# Patient Record
Sex: Female | Born: 1950 | Race: White | Hispanic: No | Marital: Married | Smoking: Never smoker
Health system: Southern US, Community
[De-identification: ages and names within clinical notes are randomized; demographics above are authoritative.]

## PROBLEM LIST (undated history)

## (undated) DIAGNOSIS — K529 Noninfective gastroenteritis and colitis, unspecified: Secondary | ICD-10-CM

## (undated) DIAGNOSIS — M1990 Unspecified osteoarthritis, unspecified site: Secondary | ICD-10-CM

## (undated) DIAGNOSIS — E7800 Pure hypercholesterolemia, unspecified: Secondary | ICD-10-CM

## (undated) HISTORY — PX: CERVICAL SPINE SURGERY: SHX589

---

## 2013-12-08 DIAGNOSIS — E669 Obesity, unspecified: Secondary | ICD-10-CM | POA: Insufficient documentation

## 2015-12-13 DIAGNOSIS — H25812 Combined forms of age-related cataract, left eye: Secondary | ICD-10-CM | POA: Insufficient documentation

## 2016-07-14 ENCOUNTER — Emergency Department
Admission: EM | Admit: 2016-07-14 | Discharge: 2016-07-14 | Disposition: A | Discharge status: Home / Self Care | Payer: Self-pay

## 2016-07-15 ENCOUNTER — Emergency Department
Admission: EM | Admit: 2016-07-15 | Discharge: 2016-07-15 | Disposition: A | Discharge status: Home / Self Care | Payer: Commercial Managed Care - HMO | Attending: Emergency Medicine | Admitting: Emergency Medicine

## 2016-07-15 ENCOUNTER — Encounter: Payer: Self-pay | Admitting: *Deleted

## 2016-07-15 ENCOUNTER — Emergency Department: Payer: Commercial Managed Care - HMO

## 2016-07-15 DIAGNOSIS — K5732 Diverticulitis of large intestine without perforation or abscess without bleeding: Secondary | ICD-10-CM | POA: Insufficient documentation

## 2016-07-15 DIAGNOSIS — R1032 Left lower quadrant pain: Secondary | ICD-10-CM

## 2016-07-15 HISTORY — DX: Noninfective gastroenteritis and colitis, unspecified: K52.9

## 2016-07-15 MED ORDER — ONDANSETRON 4 MG PO TBDP: 4.0000 mg | ORAL_TABLET | Freq: Three times a day (TID) | ORAL | 0 refills | Status: DC | PRN

## 2016-07-15 MED ORDER — ONDANSETRON HCL 4 MG/2ML IJ SOLN
4.0000 mg | Freq: Once | INTRAMUSCULAR | Status: AC
  Administered 2016-07-15: 4 mg via INTRAVENOUS
  Filled 2016-07-15: qty 2

## 2016-07-15 MED ORDER — IOPAMIDOL (ISOVUE-300) INJECTION 61%
15.0000 mL | INTRAVENOUS | Status: AC
Start: 2016-07-15 — End: 2016-07-15
  Administered 2016-07-15: 15 mL via ORAL

## 2016-07-15 MED ORDER — OXYCODONE-ACETAMINOPHEN 5-325 MG PO TABS: 1.0000 | ORAL_TABLET | Freq: Once | ORAL | Status: DC

## 2016-07-15 MED ORDER — SODIUM CHLORIDE 0.9 % IV BOLUS (SEPSIS)
1000.0000 mL | Freq: Once | INTRAVENOUS | Status: AC
  Administered 2016-07-15: 1000 mL via INTRAVENOUS

## 2016-07-15 MED ORDER — OXYCODONE-ACETAMINOPHEN 5-325 MG PO TABS: 1.0000 | ORAL_TABLET | ORAL | 0 refills | Status: DC | PRN

## 2016-07-15 MED ORDER — ONDANSETRON 4 MG PO TBDP: 4.0000 mg | ORAL_TABLET | Freq: Once | ORAL | Status: DC

## 2016-07-15 MED ORDER — MORPHINE SULFATE (PF) 2 MG/ML IV SOLN
2.0000 mg | Freq: Once | INTRAVENOUS | Status: AC
  Administered 2016-07-15: 2 mg via INTRAVENOUS
  Filled 2016-07-15: qty 1

## 2016-07-15 MED ORDER — IOPAMIDOL (ISOVUE-300) INJECTION 61%
100.0000 mL | Freq: Once | INTRAVENOUS | Status: AC | PRN
  Administered 2016-07-15: 100 mL via INTRAVENOUS

## 2016-07-15 NOTE — Discharge Instructions (Signed)
1. Continue and finish the antibiotics your doctor prescribed. 2. You may take pain and nausea medicines as needed (Percocet/Zofran #20). 3. Please take a daily stool softener while taking opiate pain medications. 4. Return to the ER for worsening symptoms, persistent vomiting, difficulty breathing or other concerns.

## 2016-07-15 NOTE — ED Provider Notes (Signed)
[facility] Emergency Department Provider Note   ____________________________________________   First MD Initiated Contact with Patient 07/15/16 [phone]     (approximate)  I have reviewed the triage vital signs and the nursing notes.   HISTORY  Chief Complaint Abdominal Pain    HPI Kathryn Russo is a 65 y.o. female who presents to the ED from home with a chief complaint of abdominal pain. Patient has a history of Crohn's disease and diverticulosis who has been experiencing left lower quadrant abdominal pain 2-3 days. She saw her PCP in [place] yesterday who placed patient on Cipro and Flagyl. Lab work done during the office visit reveals white count of 15. Her doctor instructed her to have a CT scan performed which is why she is here. Patient declines lab redraw since she just had it done approximately 12 hours ago.Denies associated chills, chest pain, shortness of breath, nausea, vomiting. Has had low-grade fever and 2 episodes of loose stool. Denies dysuria. Denies recent travel or trauma. Nothing makes her symptoms better or worse.   Past medical history None  There are no active problems to display for this patient.   No past surgical history on file.  Prior to Admission medications   Not on File    Allergies Penicillins  No family history on file.  Social History Social History  Substance Use Topics  . Smoking status: Never Smoker  . Smokeless tobacco: Never Used  . Alcohol use No    Review of Systems  Constitutional: Positive for fever. Negative for chills. Eyes: No visual changes. ENT: No sore throat. Cardiovascular: Denies chest pain. Respiratory: Denies shortness of breath. Gastrointestinal: Positive for abdominal pain.  No nausea, no vomiting.  Positive for diarrhea.  No constipation. Genitourinary: Negative for dysuria. Musculoskeletal: Negative for back pain. Skin: Negative for rash. Neurological: Negative for  headaches, focal weakness or numbness.  10-point ROS otherwise negative.  ____________________________________________   PHYSICAL EXAM:  VITAL SIGNS: ED Triage Vitals  Enc Vitals Group     BP 07/15/16 0227 117/64     Pulse Rate 07/15/16 0227 78     Resp 07/15/16 0227 18     Temp 07/15/16 0227 99.1 F (37.3 C)     Temp Source 07/15/16 0227 Oral     SpO2 07/15/16 0227 98 %     Weight 07/15/16 0229 197 lb (89.4 kg)     Height 07/15/16 0229 4\' 11"  (1.499 m)     Head Circumference --      Peak Flow --      Pain Score 07/15/16 0230 3     Pain Loc --      Pain Edu? --      Excl. in GC? --     Constitutional: Alert and oriented. Well appearing and in no acute distress. Eyes: Conjunctivae are normal. PERRL. EOMI. Head: Atraumatic. Nose: No congestion/rhinnorhea. Mouth/Throat: Mucous membranes are moist.  Oropharynx non-erythematous. Neck: No stridor.   Cardiovascular: Normal rate, regular rhythm. Grossly normal heart sounds.  Good peripheral circulation. Respiratory: Normal respiratory effort.  No retractions. Lungs CTAB. Gastrointestinal: Soft and mildly tender to palpation left lower quadrant without rebound or guarding. No distention. No abdominal bruits. No CVA tenderness. Musculoskeletal: No lower extremity tenderness nor edema.  No joint effusions. Neurologic:  Normal speech and language. No gross focal neurologic deficits are appreciated. No gait instability. Skin:  Skin is warm, dry and intact. No rash noted. Psychiatric: Mood and affect are normal. Speech and behavior  are normal.  ____________________________________________   LABS (all labs ordered are listed, but only abnormal results are displayed)  Labs Reviewed - No data to display ____________________________________________  EKG  None ____________________________________________  RADIOLOGY  CT Abdomen/Pelvis interpreted per Dr. Sunday Spillers: Acute diverticulitis of the junction of the descending and  sigmoid  colon without abscess or perforation.    Atherosclerosis.    Status post ventral hernia repair, cholecystectomy, appendectomy and  thoracolumbar fusion.   ____________________________________________   PROCEDURES  Procedure(s) performed: None  Procedures  Critical Care performed: No  ____________________________________________   INITIAL IMPRESSION / ASSESSMENT AND PLAN / ED COURSE  Pertinent labs & imaging results that were available during my care of the patient were reviewed by me and considered in my medical decision making (see chart for details).  65 year old female with a history of Crohn's disease sent by her PCP to her local emergency department to have a CT abdomen/pelvis performed for evaluation of left lower quadrant abdominal pain. Will administer analgesia and proceed with CT scan.  Clinical Course  Comment By Time  Patient improved. Updated patient and spouse of CT imaging results. She will continue Cipro and Flagyl as prescribed by her doctor. Will provide prescriptions for analgesia and antiemetic. Encouraged patient to take a stool softener while taking opiate pain medications. Strict return precautions given. Both verbalize understanding and agree with plan of care. Irean Hong, MD 10/10 [phone]     ____________________________________________   FINAL CLINICAL IMPRESSION(S) / ED DIAGNOSES  Final diagnoses:  Left lower quadrant pain  Diverticulitis of large intestine without perforation or abscess without bleeding      NEW MEDICATIONS STARTED DURING THIS VISIT:  New Prescriptions   No medications on file     Note:  This document was prepared using Dragon voice recognition software and may include unintentional dictation errors.    Irean Hong, MD 07/15/16 [phone]

## 2016-07-15 NOTE — ED Triage Notes (Addendum)
Pt has left side abd pain for 2 days.  No vomiting. Diarrhea x 2 today.  Pt saw her doctor in [place] today and was told to go to er for eval  And ct scan.  Pt has lab results with her.  Pt states she doesn't want to be stuck again for labs.    Hx diverticulosis and ulcerative colitis.  Pt alert.

## 2016-11-10 DIAGNOSIS — R519 Headache, unspecified: Secondary | ICD-10-CM | POA: Insufficient documentation

## 2017-06-16 DIAGNOSIS — G8929 Other chronic pain: Secondary | ICD-10-CM | POA: Insufficient documentation

## 2017-12-10 ENCOUNTER — Other Ambulatory Visit: Payer: Self-pay | Admitting: Orthopedic Surgery

## 2017-12-10 DIAGNOSIS — M25511 Pain in right shoulder: Secondary | ICD-10-CM

## 2017-12-19 ENCOUNTER — Ambulatory Visit: Payer: Commercial Managed Care - HMO

## 2017-12-29 ENCOUNTER — Ambulatory Visit
Admission: RE | Admit: 2017-12-29 | Discharge: 2017-12-29 | Disposition: A | Discharge status: Home / Self Care | Payer: Medicare Other | Source: Ambulatory Visit | Attending: Orthopedic Surgery | Admitting: Orthopedic Surgery

## 2017-12-29 DIAGNOSIS — M75121 Complete rotator cuff tear or rupture of right shoulder, not specified as traumatic: Secondary | ICD-10-CM | POA: Insufficient documentation

## 2017-12-29 DIAGNOSIS — M24811 Other specific joint derangements of right shoulder, not elsewhere classified: Secondary | ICD-10-CM | POA: Diagnosis not present

## 2017-12-29 DIAGNOSIS — M25511 Pain in right shoulder: Secondary | ICD-10-CM | POA: Diagnosis present

## 2018-01-06 DIAGNOSIS — M75111 Incomplete rotator cuff tear or rupture of right shoulder, not specified as traumatic: Secondary | ICD-10-CM | POA: Insufficient documentation

## 2018-02-02 ENCOUNTER — Other Ambulatory Visit: Payer: Self-pay | Admitting: Student

## 2018-02-02 ENCOUNTER — Other Ambulatory Visit: Payer: Self-pay | Admitting: Surgery

## 2018-02-02 DIAGNOSIS — M5416 Radiculopathy, lumbar region: Secondary | ICD-10-CM

## 2018-02-10 ENCOUNTER — Ambulatory Visit
Admission: RE | Admit: 2018-02-10 | Discharge: 2018-02-10 | Disposition: A | Discharge status: Home / Self Care | Payer: Medicare Other | Source: Ambulatory Visit | Attending: Surgery | Admitting: Surgery

## 2018-02-10 DIAGNOSIS — Q062 Diastematomyelia: Secondary | ICD-10-CM | POA: Diagnosis not present

## 2018-02-10 DIAGNOSIS — Q7649 Other congenital malformations of spine, not associated with scoliosis: Secondary | ICD-10-CM | POA: Insufficient documentation

## 2018-02-10 DIAGNOSIS — M4807 Spinal stenosis, lumbosacral region: Secondary | ICD-10-CM | POA: Insufficient documentation

## 2018-02-10 DIAGNOSIS — M5416 Radiculopathy, lumbar region: Secondary | ICD-10-CM | POA: Insufficient documentation

## 2018-02-10 DIAGNOSIS — Z981 Arthrodesis status: Secondary | ICD-10-CM | POA: Diagnosis not present

## 2018-03-05 DIAGNOSIS — M179 Osteoarthritis of knee, unspecified: Secondary | ICD-10-CM | POA: Insufficient documentation

## 2019-01-26 DIAGNOSIS — K580 Irritable bowel syndrome with diarrhea: Secondary | ICD-10-CM | POA: Insufficient documentation

## 2019-08-22 IMAGING — MR MR SHOULDER*R* W/O CM
5 series · 40 of 40 positions shown · non-contrast
Comparison: None.

CLINICAL DATA: Pain, weakness and limited range of motion for 1 and
half years.

EXAM:
MRI OF THE RIGHT SHOULDER WITHOUT CONTRAST
TECHNIQUE: Multiplanar, multisequence MR imaging of the shoulder was performed.
No intravenous contrast was administered.

[Series 3: T2 fat-sat · axial · 4.0mm · 0.59mm/px · z∈[-10,+74]mm · 10 of 21 slices shown (1 of 3)]
[im 1/21]
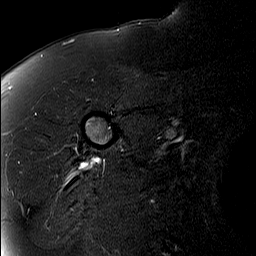
[im 3/21]
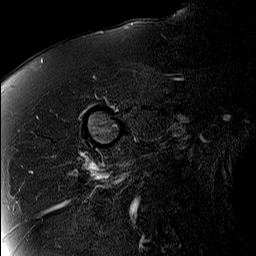
[im 5/21]
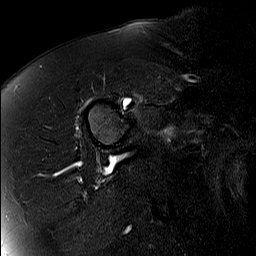
[im 7/21]
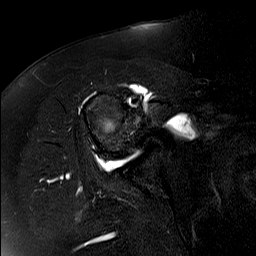
[im 9/21]
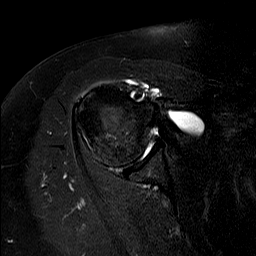
[im 12/21]
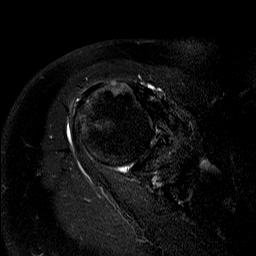
[im 14/21]
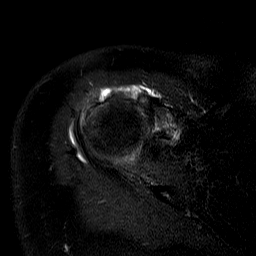
[im 16/21]
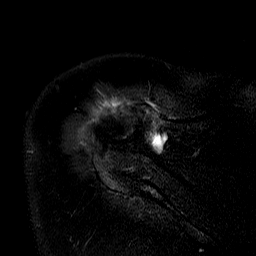
[im 18/21]
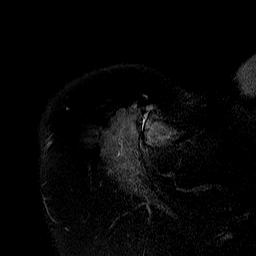
[im 21/21]
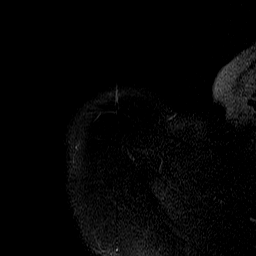

[Series 4: T2 fat-sat · oblique · 4.0mm · 0.59mm/px · 7 of 18 slices shown (2 of 3)]
[im 1/18]
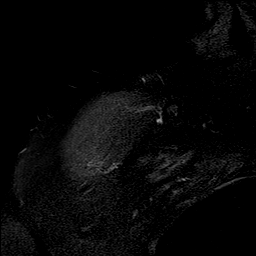
[im 3/18]
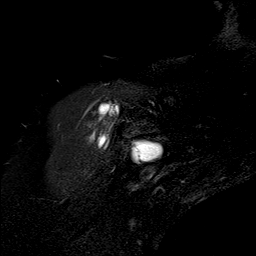
[im 6/18]
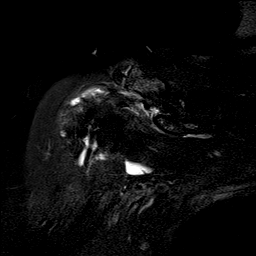
[im 9/18]
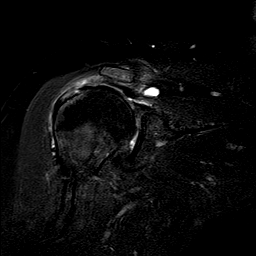
[im 12/18]
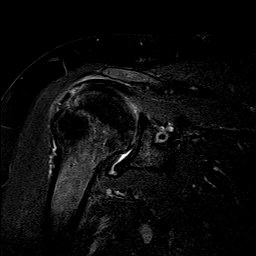
[im 15/18]
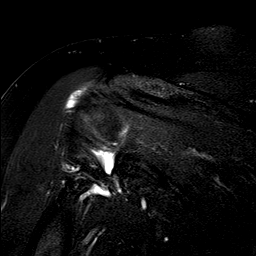
[im 18/18]
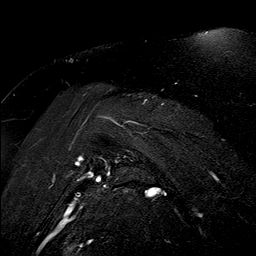

[Series 5: PD · oblique · 4.0mm · 0.59mm/px · 7 of 18 slices shown]
[im 1/18]
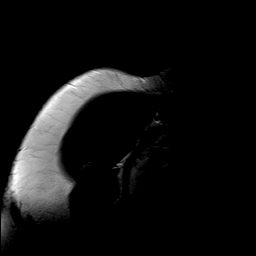
[im 3/18]
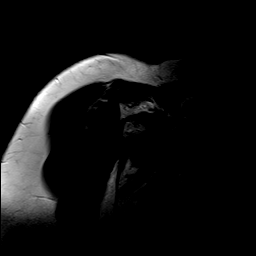
[im 6/18]
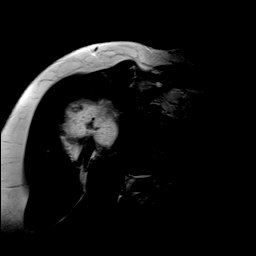
[im 9/18]
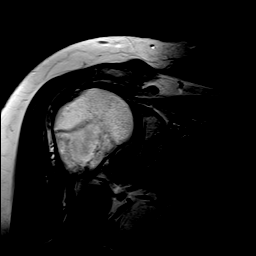
[im 12/18]
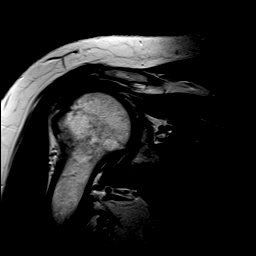
[im 15/18]
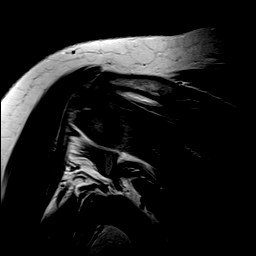
[im 18/18]
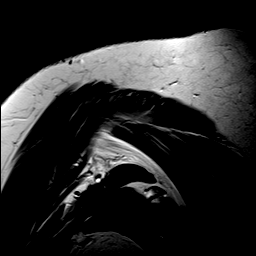

[Series 6: T1 · oblique · 4.0mm · 0.59mm/px · 8 of 20 slices shown]
[im 1/20]
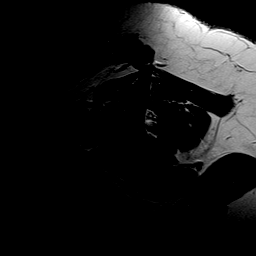
[im 3/20]
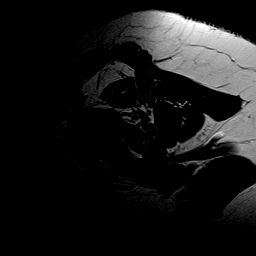
[im 6/20]
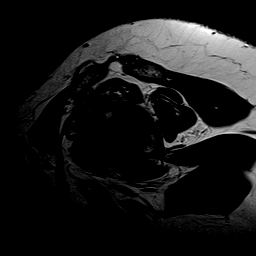
[im 9/20]
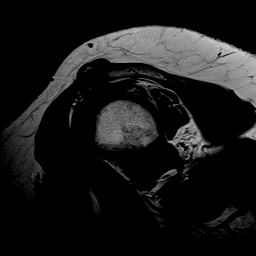
[im 11/20]
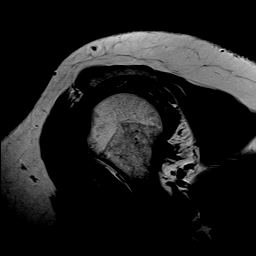
[im 14/20]
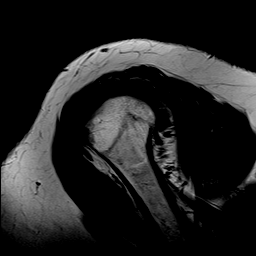
[im 17/20]
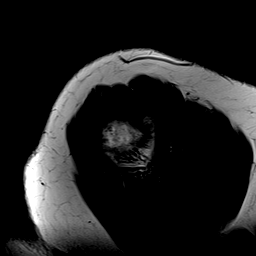
[im 20/20]
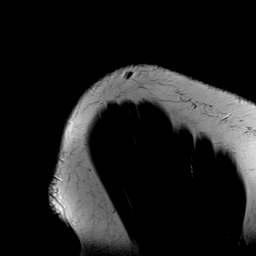

[Series 7: T2 fat-sat · oblique · 4.0mm · 0.59mm/px · 8 of 20 slices shown (3 of 3)]
[im 1/20]
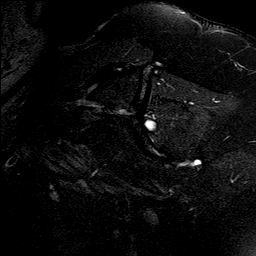
[im 3/20]
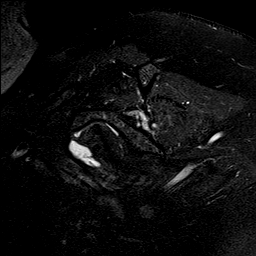
[im 6/20]
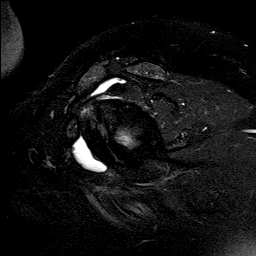
[im 9/20]
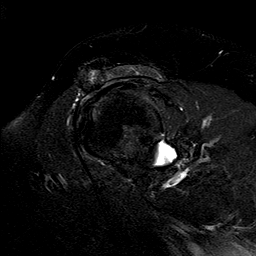
[im 11/20]
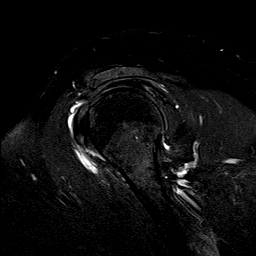
[im 14/20]
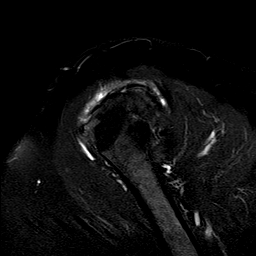
[im 17/20]
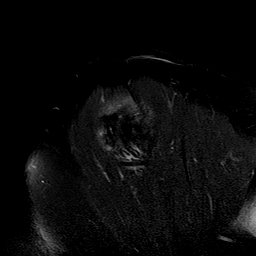
[im 20/20]
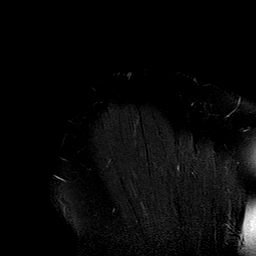

[40 of 40 positions shown; findings below may reference images not displayed]

FINDINGS: Rotator cuff: Full-thickness retracted rotator cuff tear. This
involves the supraspinatus tendon and some of the upper fibers of
the subscapularis tendon. Maximum retraction is estimated at 29 mm
and the tear is 16.5 mm wide. Significant tendinopathy involving the
infraspinatus tendon and articular surface tearing.

Muscles:  Mild fatty atrophy of the supraspinatus muscle.

Biceps long head:  Intact.

Acromioclavicular Joint: But moderate degenerative changes type 2-3
E acromion. No lateral downsloping or undersurface spurring.

Glenohumeral Joint: Mild degenerative changes with degenerative
chondrosis and early spurring. The humeral head is riding high in
the glenoid fossa.

Labrum: Significant labral degenerative changes without obvious
tear.

Bones:  No acute bony findings.

Other: Expected fluid in the subacromial/subdeltoid bursa.
IMPRESSION: 1. Full-thickness retracted supraspinatus tendon tear as described
above. Some of the upper fibers of the subscapularis tendon are also
torn.
2. Significant tendinopathy and articular surface tearing involving
the infraspinatus tendon.
3. Intact long head biceps tendon. Labral degenerative changes
without discrete tear.
4. Type 2-3 acromion and AC joint degenerative changes could
contribute to bony impingement.
5. Mild glenohumeral joint degenerative changes.

## 2022-12-10 ENCOUNTER — Other Ambulatory Visit: Payer: Self-pay | Admitting: Orthopaedic Surgery

## 2022-12-10 DIAGNOSIS — R2689 Other abnormalities of gait and mobility: Secondary | ICD-10-CM

## 2022-12-10 DIAGNOSIS — M542 Cervicalgia: Secondary | ICD-10-CM

## 2022-12-10 DIAGNOSIS — R202 Paresthesia of skin: Secondary | ICD-10-CM

## 2022-12-10 DIAGNOSIS — Z981 Arthrodesis status: Secondary | ICD-10-CM

## 2022-12-10 DIAGNOSIS — R29898 Other symptoms and signs involving the musculoskeletal system: Secondary | ICD-10-CM

## 2022-12-10 DIAGNOSIS — M5416 Radiculopathy, lumbar region: Secondary | ICD-10-CM

## 2022-12-10 DIAGNOSIS — G8929 Other chronic pain: Secondary | ICD-10-CM

## 2022-12-10 DIAGNOSIS — M4312 Spondylolisthesis, cervical region: Secondary | ICD-10-CM

## 2022-12-10 DIAGNOSIS — M5030 Other cervical disc degeneration, unspecified cervical region: Secondary | ICD-10-CM

## 2022-12-24 ENCOUNTER — Ambulatory Visit
Admission: RE | Admit: 2022-12-24 | Discharge: 2022-12-24 | Disposition: A | Discharge status: Home / Self Care | Payer: Medicare Other | Source: Ambulatory Visit | Attending: Orthopaedic Surgery | Admitting: Orthopaedic Surgery

## 2022-12-24 DIAGNOSIS — M5416 Radiculopathy, lumbar region: Secondary | ICD-10-CM

## 2022-12-24 DIAGNOSIS — M5442 Lumbago with sciatica, left side: Secondary | ICD-10-CM | POA: Diagnosis present

## 2022-12-24 DIAGNOSIS — G8929 Other chronic pain: Secondary | ICD-10-CM | POA: Diagnosis present

## 2022-12-24 DIAGNOSIS — M5441 Lumbago with sciatica, right side: Secondary | ICD-10-CM | POA: Insufficient documentation

## 2022-12-24 DIAGNOSIS — M4312 Spondylolisthesis, cervical region: Secondary | ICD-10-CM | POA: Insufficient documentation

## 2022-12-24 DIAGNOSIS — Z981 Arthrodesis status: Secondary | ICD-10-CM

## 2022-12-24 DIAGNOSIS — R2689 Other abnormalities of gait and mobility: Secondary | ICD-10-CM

## 2022-12-24 DIAGNOSIS — R202 Paresthesia of skin: Secondary | ICD-10-CM | POA: Diagnosis present

## 2022-12-24 DIAGNOSIS — M5030 Other cervical disc degeneration, unspecified cervical region: Secondary | ICD-10-CM | POA: Diagnosis present

## 2022-12-24 DIAGNOSIS — M542 Cervicalgia: Secondary | ICD-10-CM

## 2022-12-24 DIAGNOSIS — R29898 Other symptoms and signs involving the musculoskeletal system: Secondary | ICD-10-CM | POA: Diagnosis present

## 2023-03-30 DIAGNOSIS — N183 Chronic kidney disease, stage 3 unspecified: Secondary | ICD-10-CM | POA: Insufficient documentation

## 2023-09-28 ENCOUNTER — Encounter: Payer: Self-pay | Admitting: Emergency Medicine

## 2023-09-28 ENCOUNTER — Other Ambulatory Visit: Payer: Self-pay

## 2023-09-28 ENCOUNTER — Emergency Department: Payer: Medicare Other

## 2023-09-28 ENCOUNTER — Emergency Department
Admission: EM | Admit: 2023-09-28 | Discharge: 2023-09-28 | Disposition: A | Discharge status: Home / Self Care | Payer: Medicare Other | Attending: Emergency Medicine | Admitting: Emergency Medicine

## 2023-09-28 DIAGNOSIS — M542 Cervicalgia: Secondary | ICD-10-CM | POA: Diagnosis not present

## 2023-09-28 DIAGNOSIS — S43004A Unspecified dislocation of right shoulder joint, initial encounter: Secondary | ICD-10-CM

## 2023-09-28 DIAGNOSIS — M8580 Other specified disorders of bone density and structure, unspecified site: Secondary | ICD-10-CM | POA: Diagnosis not present

## 2023-09-28 DIAGNOSIS — E785 Hyperlipidemia, unspecified: Secondary | ICD-10-CM | POA: Insufficient documentation

## 2023-09-28 DIAGNOSIS — M25521 Pain in right elbow: Secondary | ICD-10-CM | POA: Diagnosis not present

## 2023-09-28 DIAGNOSIS — N189 Chronic kidney disease, unspecified: Secondary | ICD-10-CM | POA: Diagnosis not present

## 2023-09-28 DIAGNOSIS — M25511 Pain in right shoulder: Secondary | ICD-10-CM | POA: Diagnosis present

## 2023-09-28 DIAGNOSIS — I672 Cerebral atherosclerosis: Secondary | ICD-10-CM | POA: Diagnosis not present

## 2023-09-28 DIAGNOSIS — S43014A Anterior dislocation of right humerus, initial encounter: Secondary | ICD-10-CM | POA: Insufficient documentation

## 2023-09-28 DIAGNOSIS — Z981 Arthrodesis status: Secondary | ICD-10-CM | POA: Insufficient documentation

## 2023-09-28 DIAGNOSIS — W19XXXA Unspecified fall, initial encounter: Secondary | ICD-10-CM

## 2023-09-28 DIAGNOSIS — W1839XA Other fall on same level, initial encounter: Secondary | ICD-10-CM | POA: Insufficient documentation

## 2023-09-28 DIAGNOSIS — Y9389 Activity, other specified: Secondary | ICD-10-CM | POA: Diagnosis not present

## 2023-09-28 HISTORY — DX: Pure hypercholesterolemia, unspecified: E78.00

## 2023-09-28 HISTORY — DX: Unspecified osteoarthritis, unspecified site: M19.90

## 2023-09-28 LAB — BASIC METABOLIC PANEL
Anion gap: 14 (ref 5–15)
BUN: 17 mg/dL (ref 8–23)
CO2: 21 mmol/L — ABNORMAL LOW (ref 22–32)
Calcium: 9 mg/dL (ref 8.9–10.3)
Chloride: 101 mmol/L (ref 98–111)
Creatinine, Ser: 0.82 mg/dL (ref 0.44–1.00)
GFR, Estimated: >60 mL/min (ref >60)
Glucose, Bld: 121 mg/dL — ABNORMAL HIGH (ref 70–99)
Potassium: 3.4 mmol/L — ABNORMAL LOW (ref 3.5–5.1)
Sodium: 136 mmol/L (ref 135–145)

## 2023-09-28 LAB — CBC WITH DIFFERENTIAL/PLATELET
Abs Immature Granulocytes: 0.05 10*3/uL (ref 0.00–0.07)
Basophils Absolute: 0 10*3/uL (ref 0.0–0.1)
Basophils Relative: 0 %
Eosinophils Absolute: 0.1 10*3/uL (ref 0.0–0.5)
Eosinophils Relative: 0 %
HCT: 41.2 % (ref 36.0–46.0)
Hemoglobin: 13.4 g/dL (ref 12.0–15.0)
Immature Granulocytes: 0 %
Lymphocytes Relative: 7 %
Lymphs Abs: 1 10*3/uL (ref 0.7–4.0)
MCH: 29.3 pg (ref 26.0–34.0)
MCHC: 32.5 g/dL (ref 30.0–36.0)
MCV: 90.2 fL (ref 80.0–100.0)
Monocytes Absolute: 0.8 10*3/uL (ref 0.1–1.0)
Monocytes Relative: 6 %
Neutro Abs: 11.9 10*3/uL — ABNORMAL HIGH (ref 1.7–7.7)
Neutrophils Relative %: 87 %
Platelets: 226 10*3/uL (ref 150–400)
RBC: 4.57 MIL/uL (ref 3.87–5.11)
RDW: 13.7 % (ref 11.5–15.5)
WBC: 13.8 10*3/uL — ABNORMAL HIGH (ref 4.0–10.5)
nRBC: 0 % (ref 0.0–0.2)

## 2023-09-28 MED ORDER — OXYCODONE-ACETAMINOPHEN 5-325 MG PO TABS
1.0000 | ORAL_TABLET | Freq: Once | ORAL | Status: AC
  Administered 2023-09-28: 1 via ORAL
  Filled 2023-09-28: qty 1

## 2023-09-28 MED ORDER — MORPHINE SULFATE (PF) 4 MG/ML IV SOLN
4.0000 mg | INTRAVENOUS | Status: DC | PRN
  Administered 2023-09-28: 4 mg via INTRAVENOUS
  Filled 2023-09-28: qty 1

## 2023-09-28 MED ORDER — PROPOFOL 10 MG/ML IV BOLUS: 0.5000 mg/kg | Freq: Once | INTRAVENOUS | Status: DC

## 2023-09-28 MED ORDER — PROPOFOL 10 MG/ML IV BOLUS
INTRAVENOUS | Status: AC | PRN
  Administered 2023-09-28: 70 mg via INTRAVENOUS

## 2023-09-28 MED ORDER — ONDANSETRON HCL 4 MG/2ML IJ SOLN
4.0000 mg | Freq: Once | INTRAMUSCULAR | Status: AC
  Administered 2023-09-28: 4 mg via INTRAVENOUS
  Filled 2023-09-28: qty 2

## 2023-09-28 MED ORDER — PROPOFOL 10 MG/ML IV BOLUS
1.0000 mg/kg | Freq: Once | INTRAVENOUS | Status: DC
  Filled 2023-09-28: qty 20

## 2023-09-28 MED ORDER — KETAMINE HCL 50 MG/5ML IJ SOSY: 1.0000 mg/kg | PREFILLED_SYRINGE | Freq: Once | INTRAMUSCULAR | Status: DC

## 2023-09-28 NOTE — ED Triage Notes (Addendum)
Pt to ED via [facility] from [employer]. Pt w/ right shoulder, arm and elbow pain. PMS present per EMS. Pt also reports right knee pain. Pt reports had spinal fusion in July and is reporting neck pain. Pt denies blood thinners. No LOC. No head trauma. Pt has been self splitting. Pt tearful in triage. RN attempting to put pt in c-collar but pt refused due to increase pain. RN unable to feel radial pulse. EMS reports pulse present. Pt taken to room 54. NP made aware.   EMS VS 132/72 100% RA HR 76

## 2023-09-28 NOTE — ED Notes (Signed)
See triage note  Presents via EMS s/p fall  States she fell in [employer]  landed on right side   Having pain to neck,right arm and shoulder

## 2023-09-28 NOTE — ED Provider Notes (Signed)
.  Sedation  Date/Time: 09/28/2023 7:40 PM  Performed by: Chesley Noon, MD Authorized by: Chesley Noon, MD   Consent:    Consent obtained:  Verbal   Consent given by:  Patient   Risks discussed:  Allergic reaction, dysrhythmia, inadequate sedation, nausea, prolonged hypoxia resulting in organ damage, prolonged sedation necessitating reversal, respiratory compromise necessitating ventilatory assistance and intubation and vomiting   Alternatives discussed:  Analgesia without sedation, anxiolysis and regional anesthesia Universal protocol:    Procedure explained and questions answered to patient or proxy's satisfaction: yes     Relevant documents present and verified: yes     Test results available: yes     Imaging studies available: yes     Required blood products, implants, devices, and special equipment available: yes     Site/side marked: yes     Immediately prior to procedure, a time out was called: yes     Patient identity confirmed:  Verbally with patient Indications:    Procedure performed:  Dislocation reduction   Procedure necessitating sedation performed by:  Different physician Pre-sedation assessment:    Time since last food or drink:  8   ASA classification: class 2 - patient with mild systemic disease     Mouth opening:  3 or more finger widths   Thyromental distance:  4 finger widths   Mallampati score:  I - soft palate, uvula, fauces, pillars visible   Neck mobility: normal     Pre-sedation assessments completed and reviewed: airway patency, cardiovascular function, hydration status, mental status, nausea/vomiting, pain level, respiratory function and temperature   A pre-sedation assessment was completed prior to the start of the procedure Immediate pre-procedure details:    Reassessment: Patient reassessed immediately prior to procedure     Reviewed: vital signs, relevant labs/tests and NPO status     Verified: bag valve mask available, emergency equipment  available, intubation equipment available, IV patency confirmed, oxygen available and suction available   Procedure details (see MAR for exact dosages):    Preoxygenation:  Nasal cannula   Sedation:  Propofol   Intended level of sedation: deep   Intra-procedure monitoring:  Blood pressure monitoring, cardiac monitor, continuous pulse oximetry, frequent LOC assessments, frequent vital sign checks and continuous capnometry   Intra-procedure events: none     Intra-procedure management:  Airway repositioning   Total Provider sedation time (minutes):  10 Post-procedure details:   A post-sedation assessment was completed following the completion of the procedure.   Attendance: Constant attendance by certified staff until patient recovered     Recovery: Patient returned to pre-procedure baseline     Post-sedation assessments completed and reviewed: airway patency, cardiovascular function, hydration status, mental status, nausea/vomiting, pain level, respiratory function and temperature     Patient is stable for discharge or admission: yes     Procedure completion:  Tolerated well, no immediate complications     Chesley Noon, MD 09/28/23 1942

## 2023-09-28 NOTE — ED Provider Notes (Signed)
[facility] Provider Note    None    (approximate)   History   Fall   HPI  Kathryn Russo is a 72 y.o. female with history of anemia, CKD, hyperlipidemia, osteoarthritis, osteopenia,  and as listed in EMR presents to the emergency department for treatment and evaluation after mechanical, nonsyncopal fall while shopping today.  She fell forward after turning a corner, landing on her right outstretched hand.  She also landed on the right knee however is not complaining of pain in the knee at this time.  She is having significant pain in the right shoulder. Injury occurred about 12:30 this afternoon. Previous rotator cuff repair but no previous fractures or dislocations of the right shoulder.  She denies striking her head during the fall and denies loss of consciousness.  She had cervical fusion surgery several months ago and is having some pain in the mid lower neck area.      Physical Exam   Triage Vital Signs: ED Triage Vitals  Encounter Vitals Group     BP 09/28/23 1517 99/85     Systolic BP Percentile --      Diastolic BP Percentile --      Pulse Rate 09/28/23 1514 85     Resp 09/28/23 1514 (!) 26     Temp 09/28/23 1514 98.1 F (36.7 C)     Temp Source 09/28/23 1514 Oral     SpO2 09/28/23 1514 95 %     Weight --      Height --      Head Circumference --      Peak Flow --      Pain Score 09/28/23 1515 10     Pain Loc --      Pain Education --      Exclude from Growth Chart --     Most recent vital signs: Vitals:   09/28/23 1845 09/28/23 1930  BP: (!) 144/70 139/63  Pulse: 62 63  Resp: 17 18  Temp:    SpO2: 100% 100%    General: Awake, no distress. Uncomfortable appearing. CV:  Good peripheral perfusion. Radial pulses 2+. No open wounds/bleeding. Resp:  Normal effort. Breath sounds clear to auscultation. Abd:  No distention. Soft. Nontender Other:  Diffuse tenderness over lower cervical spine and associated muscles on the right  side. Guarding right shoulder. Demonstrates flexion and extension of right elbow and wrist. No pain in either hip with knee flexion or rotation. No focal tenderness with ROM of knees.No pain with ROM of left upper extremity.    ED Results / Procedures / Treatments   Labs (all labs ordered are listed, but only abnormal results are displayed) Labs Reviewed  CBC WITH DIFFERENTIAL/PLATELET - Abnormal; Notable for the following components:      Result Value   WBC 13.8 (*)    Neutro Abs 11.9 (*)    All other components within normal limits  BASIC METABOLIC PANEL - Abnormal; Notable for the following components:   Potassium 3.4 (*)    CO2 21 (*)    Glucose, Bld 121 (*)    All other components within normal limits     EKG  Not indicated.   RADIOLOGY  Image and radiology report reviewed and interpreted by me. Radiology report consistent with the same.  Image of the right shoulder with obvious anterior dislocation. No noted fracture.  Images of the head and cervical spine pending upon transition of care.  PROCEDURES:  Critical Care  performed: No  Procedures  Reduction of dislocation Date/Time: 10:34 PM Performed by: Kem Boroughs Authorized by: Kem Boroughs Consent: Verbal consent obtained. Risks and benefits: risks, benefits and alternatives were discussed Consent given by: patient Required items: required blood products, implants, devices, and special equipment available Time out: Immediately prior to procedure a "time out" was called to verify the correct patient, procedure, equipment, support staff and site/side marked as required.  Patient sedated: Yes under MD supervision  Vitals: Vital signs were monitored during sedation. Patient tolerance: Patient tolerated the procedure well with no immediate complications. Joint: right shoulder Reduction technique:  traction     MEDICATIONS ORDERED IN ED:  Medications  morphine (PF) 4 MG/ML injection 4 mg (4 mg  Intravenous Given 09/28/23 1703)  propofol (DIPRIVAN) 10 mg/mL bolus/IV push 89.4 mg (has no administration in time range)  oxyCODONE-acetaminophen (PERCOCET/ROXICET) 5-325 MG per tablet 1 tablet (1 tablet Oral Given 09/28/23 1534)  ondansetron (ZOFRAN) injection 4 mg (4 mg Intravenous Given 09/28/23 1703)  propofol (DIPRIVAN) 10 mg/mL bolus/IV push (70 mg Intravenous Given 09/28/23 1801)     IMPRESSION / MDM / ASSESSMENT AND PLAN / ED COURSE   I have reviewed the triage note.  Differential diagnosis includes, but is not limited to, cervical vertebral injury, fracture within shoulder/right upper extremity, rotator cuff injury.   Patient's presentation is most consistent with acute presentation with potential threat to life or bodily function.  72 year old female presents to the ER after falling while going into a store. See HPI. Plan will be to get imaging and assist with pain control. EMS unable to establish IV enroute. PO medications ordered.  As expected, right shoulder dislocation present on x-ray. CT imaging pending. Results discussed with patient and husband.  Patient to be moved to room 16 for reduction with conscious sedation.   ----------------------------------------- 7:32 PM on 09/28/2023 -----------------------------------------  Reduction successful with single attempt. Postreduction imaging obtained and shoulder immobilizer/sling ordered. She tells me she has an upcoming appointment with primary care on Jan 3 and will also schedule an appointment with her orthopedic specialist if needed. She was advised to wear the sling except for showering. She was also advised to avoid raising her arm over her head. Patient safe for discharge home.     FINAL CLINICAL IMPRESSION(S) / ED DIAGNOSES   Final diagnoses:  Fall, initial encounter  Dislocation of right shoulder joint, initial encounter     Rx / DC Orders   ED Discharge Orders     None        Note:  This  document was prepared using Dragon voice recognition software and may include unintentional dictation errors.   Chinita Pester, FNP 09/28/23 2234    Pilar Jarvis, MD 09/28/23 [phone]

## 2023-10-15 ENCOUNTER — Ambulatory Visit: Payer: Medicare Other

## 2023-10-19 ENCOUNTER — Other Ambulatory Visit: Payer: Self-pay | Admitting: Nurse Practitioner

## 2023-10-19 DIAGNOSIS — Z8739 Personal history of other diseases of the musculoskeletal system and connective tissue: Secondary | ICD-10-CM

## 2023-10-22 ENCOUNTER — Ambulatory Visit
Admission: RE | Admit: 2023-10-22 | Discharge: 2023-10-22 | Disposition: A | Discharge status: Home / Self Care | Payer: Medicare Other | Source: Ambulatory Visit | Attending: Nurse Practitioner | Admitting: Nurse Practitioner

## 2023-10-22 DIAGNOSIS — Z8739 Personal history of other diseases of the musculoskeletal system and connective tissue: Secondary | ICD-10-CM | POA: Diagnosis present

## 2023-10-23 ENCOUNTER — Other Ambulatory Visit: Payer: Medicare Other

## 2023-11-18 ENCOUNTER — Ambulatory Visit: Payer: Medicare Other | Admitting: Orthopedic Surgery

## 2023-11-18 ENCOUNTER — Other Ambulatory Visit (INDEPENDENT_AMBULATORY_CARE_PROVIDER_SITE_OTHER): Payer: Self-pay

## 2023-11-18 DIAGNOSIS — M25512 Pain in left shoulder: Secondary | ICD-10-CM

## 2023-11-20 ENCOUNTER — Encounter: Payer: Self-pay | Admitting: Orthopedic Surgery

## 2023-11-20 NOTE — Progress Notes (Addendum)
Office Visit Note   Patient: Kathryn Russo           Date of Birth: 07/31/1951           MRN: [id number] Visit Date: 11/18/2023 Requested by: Rennis Harding, MD [address] [place],  [place] [address] PCP: Rennis Harding, MD  Subjective: Chief Complaint  Patient presents with   Right Shoulder - Pain    HPI: Kathryn Russo is a 73 y.o. female who presents to the office reporting right and left shoulder pain.  Patient is here for second opinion.  She had a dislocation after a fall on 09/28/2023.  She was at [facility] for that.. Patient states that her shoulder was out about 5 hours before they reduced it.  She has a history of prior right shoulder rotator cuff tear repair done in 2019 by Dr. Adah Salvage.  She has seen him since this most recent injury and was told she needed reverse shoulder replacement.  She is here for second opinion regarding that. recommendation.  She has had physical therapy for 1 visit.  Has a history of C4-C6 fusion performed in July 2024 but does not report any neck pain with this most recent injury.  Did have follow-up with her spine surgeon 3 weeks ago.  She states that she is right-hand dominant and has very limited range of motion and use of that right arm.  She reports constant pain as well as some weakness in that right arm.  She was scheduled for foot surgery but had to cancel because she cannot really function using crutches at this time.  She has had an MRI scan of the right shoulder from 10/29/2023.  That was a nonarthrogram study.  Shows full-thickness retear of the supraspinatus measuring 9 mm with no muscle atrophy or edema. There is  severe tendinosis of the intra-articular portion of the long head of the biceps tendon.  There is some thickening of the inferior joint capsule as can be seen with adhesive capsulitis per report.  No deltoid muscle edema..  Accompanying CT report also shows mild atrophy of the infraspinatus and supraspinatus  muscle bellies.              ROS: All systems reviewed are negative as they relate to the chief complaint within the history of present illness.  Patient denies fevers or chills.  Assessment & Plan: Visit Diagnoses:  1. Left shoulder pain, unspecified chronicity     Plan: Impression is right shoulder pain with recurrent rotator cuff tear in a 73 year old patient who had shoulder dislocation 6 weeks ago.  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  Still has paresthesias in that deltoid region.  Asymmetric deltoid weakness is present right versus left.  Recurrent rotator cuff tear itself does not look that bad on MRI scan but her functional level is significantly diminished.  She does have well-maintained passive range of motion in that right arm.  I think would be good for [name] to wait on reverse shoulder replacement until her axillary nerve is more fully recovered.  She should continue to maintain passive range of motion in that arm.  Recommended 5-month recheck prior to reverse replacement at this time.  Notably there is a history of bilateral DVT and pulmonary embolism in her parents.  Unclear if that has ever been worked up fully for protein C or protein S deficiency or Leiden factor.  That would be a  consideration prior to reverse replacement as well.  She is going to consider her options and she will follow-up with [place] as needed.  Follow-Up Instructions: No follow-ups on file.   Orders:  Orders Placed This Encounter  Procedures   XR Shoulder Left   No orders of the defined types were placed in this encounter.     Procedures: No procedures performed   Clinical Data: No additional findings.  Objective: Vital Signs: There were no vitals taken for this visit.  Physical Exam:  Constitutional: Patient appears well-developed HEENT:  Head: Normocephalic Eyes:EOM are normal Neck: Normal range of motion Cardiovascular: Normal rate Pulmonary/chest: Effort  normal Neurologic: Patient is alert Skin: Skin is warm Psychiatric: Patient has normal mood and affect  Ortho Exam: Ortho exam demonstrates passive range of motion of the right shoulder above 90 degrees of forward flexion and abduction.  Actively she has about 30 degrees of forward flexion and abduction.  Deltoid does fire but it is significantly weaker than the left-hand side.  X-ray external rotation strength is 5- out of 5 on the right compared to 5+ out of 5 on the left.  No Popeye deformity but she does have some bicipital groove tenderness but no discrete AC joint tenderness on the right-hand side.  She does have paresthesias in the axillary distribution around the deltoid on the right side which is not present on the left.  She otherwise has 5 out of 5 grip EPL FPL interosseous wrist flexion extension bicep triceps and deltoid strength.  Radial pulse intact bilaterally.  Left shoulder has reasonable range of motion with good rotator cuff strength and some slight crepitus with passive range of motion 9 degrees of abduction.  She may have some early rotator cuff pathology in that left shoulder as well but her shoulder is very functional on that side at this time and she wants to hold off on any workup for now.  Radiographs unremarkable in that regard on the left shoulder.  Specialty Comments:  No specialty comments available.  Imaging: No results found.   PMFS History: There are no active problems to display for this patient.  Past Medical History:  Diagnosis Date   Arthritis    Colitis    High cholesterol     History reviewed. No pertinent family history.  Past Surgical History:  Procedure Laterality Date   CERVICAL SPINE SURGERY     Social History   Occupational History   Not on file  Tobacco Use   Smoking status: Never   Smokeless tobacco: Never  Substance and Sexual Activity   Alcohol use: No   Drug use: Never   Sexual activity: Not Currently

## 2023-11-24 ENCOUNTER — Encounter: Payer: Self-pay | Admitting: Orthopedic Surgery

## 2023-12-18 ENCOUNTER — Encounter: Payer: Self-pay | Admitting: Orthopedic Surgery

## 2023-12-18 ENCOUNTER — Ambulatory Visit: Payer: Medicare Other | Admitting: Orthopedic Surgery

## 2023-12-18 DIAGNOSIS — M1712 Unilateral primary osteoarthritis, left knee: Secondary | ICD-10-CM

## 2023-12-18 MED ORDER — METHYLPREDNISOLONE ACETATE 40 MG/ML IJ SUSP
40.0000 mg | INTRAMUSCULAR | Status: AC | PRN
  Administered 2023-12-18: 40 mg via INTRA_ARTICULAR

## 2023-12-18 MED ORDER — LIDOCAINE HCL 1 % IJ SOLN
5.0000 mL | INTRAMUSCULAR | Status: AC | PRN
  Administered 2023-12-18: 5 mL

## 2023-12-18 MED ORDER — BUPIVACAINE HCL 0.25 % IJ SOLN
4.0000 mL | INTRAMUSCULAR | Status: AC | PRN
  Administered 2023-12-18: 4 mL via INTRA_ARTICULAR

## 2023-12-18 NOTE — Progress Notes (Signed)
 Office Visit Note   Patient: Kathryn Russo           Date of Birth: April 19, 1951           MRN: [id number] Visit Date: 12/18/2023 Requested by: Rennis Harding, MD [address] [place],  [place] [address] PCP: Rennis Harding, MD  Subjective: Chief Complaint  Patient presents with   Right Shoulder - Pain    Wants to discuss surgical options    HPI: Kathryn Russo is a 73 y.o. female who presents to the office reporting bilateral shoulder pain right worse than left.  Was seen about a month ago for second opinion.  At that time she had a prior rotator cuff tear repair and subsequent shoulder dislocation.  Had some axillary nerve injury.  Overall she states that she is better.  She is able to get overhead and the numbness on the lateral side of her right shoulder is essentially gone.  Feels like she may be having some "subluxations" when she sleeps.  She also reports bilateral knee pain with known history of bilateral knee arthritis left worse than right..                ROS: All systems reviewed are negative as they relate to the chief complaint within the history of present illness.  Patient denies fevers or chills.  Assessment & Plan: Visit Diagnoses:  1. Arthritis of left knee     Plan: Impression is bilateral knee arthritis left worse than right.  No effusion but about a 10-12 flexion contracture on the left and about a 5 degree flexion traction on the right.  Does bend past 90 on both knees.  Left knee is injected today.  Plan 78-month return for clinical recheck on the arm.  Overall the arm is doing well.  Reverse shoulder replacement not indicated at this time.  I think if she continues with dislocation/subluxation episodes we could consider that intervention but for now I think she is in a fairly reasonable place where her shoulder is not perfect but it is passable.  Is getting better with return of deltoid function.  Follow-Up Instructions: No follow-ups  on file.   Orders:  No orders of the defined types were placed in this encounter.  No orders of the defined types were placed in this encounter.     Procedures: Large Joint Inj: L knee on 12/18/2023 12:01 PM Indications: diagnostic evaluation, joint swelling and pain Details: 18 G 1.5 in needle, superolateral approach  Arthrogram: No  Medications: 5 mL lidocaine 1 %; 40 mg methylPREDNISolone acetate 40 MG/ML; 4 mL bupivacaine 0.25 % Outcome: tolerated well, no immediate complications Procedure, treatment alternatives, risks and benefits explained, specific risks discussed. Consent was given by the patient. Immediately prior to procedure a time out was called to verify the correct patient, procedure, equipment, support staff and site/side marked as required. Patient was prepped and draped in the usual sterile fashion.       Clinical Data: No additional findings.  Objective: Vital Signs: There were no vitals taken for this visit.  Physical Exam:  Constitutional: Patient appears well-developed HEENT:  Head: Normocephalic Eyes:EOM are normal Neck: Normal range of motion Cardiovascular: Normal rate Pulmonary/chest: Effort normal Neurologic: Patient is alert Skin: Skin is warm Psychiatric: Patient has normal mood and affect  Ortho Exam: Ortho exam demonstrates range of motion on the right of 40/80/140.  She does have active forward flexion abduction above 90 degrees.  No paresthesias in the axillary distribution on the right-hand side.  Little bit more crepitus on the left with passive range of motion of the right.  Patient currently has 5 out of 5 external rotation and internal rotation strength testing at 15 degrees of abduction on the right-hand side.  Does have 12 degree flexion contracture on the left side reflex contracture on the right with no effusion in either knee.  Specialty Comments:  No specialty comments available.  Imaging: No results found.   PMFS  History: There are no active problems to display for this patient.  Past Medical History:  Diagnosis Date   Arthritis    Colitis    High cholesterol     No family history on file.  Past Surgical History:  Procedure Laterality Date   CERVICAL SPINE SURGERY     Social History   Occupational History   Not on file  Tobacco Use   Smoking status: Never   Smokeless tobacco: Never  Substance and Sexual Activity   Alcohol use: No   Drug use: Never   Sexual activity: Not Currently

## 2024-02-10 ENCOUNTER — Telehealth: Payer: Self-pay

## 2024-02-10 ENCOUNTER — Ambulatory Visit (INDEPENDENT_AMBULATORY_CARE_PROVIDER_SITE_OTHER): Admitting: Orthopedic Surgery

## 2024-02-10 ENCOUNTER — Encounter: Payer: Self-pay | Admitting: Orthopedic Surgery

## 2024-02-10 DIAGNOSIS — M79601 Pain in right arm: Secondary | ICD-10-CM | POA: Diagnosis not present

## 2024-02-10 NOTE — Telephone Encounter (Signed)
Auth needed for left knee gel 

## 2024-02-10 NOTE — Progress Notes (Signed)
 Office Visit Note   Patient: Kathryn Russo           Date of Birth: 17-Jul-1951           MRN: [id number] Visit Date: 02/10/2024 Requested by: Mick Alamin, MD [address] [place],  [place] [address] PCP: Mick Alamin, MD  Subjective: Chief Complaint  Patient presents with   Right Shoulder - Pain, Follow-up   Left Knee - Pain, Follow-up    HPI: Kathryn Russo is a 73 y.o. female who presents to the office reporting for follow-up of the right shoulder.  She does report bilateral shoulder pain right worse than left.  She feels like she may be having some subluxations in her sleep.  States that the shoulders "do not feel right".  Does report some right upper extremity nerve type pain as well as some scapular pain.  She describes numbness and tingling in the right arm.  She did have axillary nerve injury after shoulder dislocation about 5 to 6 months ago.  She has recurrent rotator cuff tear but overall has been making reasonable progress in that regard in terms of getting her shoulder functional.  She also describes left knee pain.  Has had prior injection 12/18/2023 which gave her relief for several weeks.  That was a cortisone injection.  She states she knows she has arthritis in the knee and was told she might need a knee replacement.  States that she has increased pain with activities on that left-hand side.  She is using a cane.  She recently had a rather extensive emergency department workup for medical problem all of which was negative..                ROS: All systems reviewed are negative as they relate to the chief complaint within the history of present illness.  Patient denies fevers or chills.  Assessment & Plan: Visit Diagnoses:  1. Right arm pain     Plan: Impression is left knee pain with arthritis.  No effusion and pretty reasonable range of motion today.  We will preapproved for for gel injection.  She wants to avoid knee replacement if  possible. This patient is diagnosed with osteoarthritis of the knee(s).    Radiographs show evidence of joint space narrowing, osteophytes, subchondral sclerosis and/or subchondral cysts.  This patient has knee pain which interferes with functional and activities of daily living.    This patient has experienced inadequate response, adverse effects and/or intolerance with conservative treatments such as acetaminophen , NSAIDS, topical creams, physical therapy or regular exercise, knee bracing and/or weight loss.   This patient has experienced inadequate response or has a contraindication to intra articular steroid injections for at least 3 months.   This patient is not scheduled to have a total knee replacement within 6 months of starting treatment with viscosupplementation.   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  However she does have continued symptoms at 6 months following injury.  Nerve study indicated to evaluate recovery of the axillary nerve as well as any other compression or injury to her median and ulnar nerves in particular.  Follow-Up Instructions: No follow-ups on file.   Orders:  Orders Placed This Encounter  Procedures   Ambulatory referral to Physical Medicine Rehab   No orders of the defined types were placed in this encounter.     Procedures: No procedures performed   Clinical  Data: No additional findings.  Objective: Vital Signs: There were no vitals taken for this visit.  Physical Exam:  Constitutional: Patient appears well-developed HEENT:  Head: Normocephalic Eyes:EOM are normal Neck: Normal range of motion Cardiovascular: Normal rate Pulmonary/chest: Effort normal Neurologic: Patient is alert Skin: Skin is warm Psychiatric: Patient has normal mood and affect  Ortho Exam: Ortho exam demonstrates some weakness external rotation on the right compared to the left.  No masses lymphadenopathy  or skin changes noted in that shoulder girdle region.  Deltoid fires well bit better than it did last time.  She has 5 out of 5 grip EPL FPL interosseous wrist flexion extension biceps triceps and deltoid strength.  Mild paresthesias in the ulnar distribution with negative Tinel's in the cubital tunnel on the right and no subluxation of the nerve.  Specialty Comments:  No specialty comments available.  Imaging: No results found.   PMFS History: There are no active problems to display for this patient.  Past Medical History:  Diagnosis Date   Arthritis    Colitis    High cholesterol     No family history on file.  Past Surgical History:  Procedure Laterality Date   CERVICAL SPINE SURGERY     Social History   Occupational History   Not on file  Tobacco Use   Smoking status: Never   Smokeless tobacco: Never  Substance and Sexual Activity   Alcohol use: No   Drug use: Never   Sexual activity: Not Currently

## 2024-02-23 ENCOUNTER — Ambulatory Visit (INDEPENDENT_AMBULATORY_CARE_PROVIDER_SITE_OTHER): Admitting: Physical Medicine and Rehabilitation

## 2024-02-23 DIAGNOSIS — M79601 Pain in right arm: Secondary | ICD-10-CM

## 2024-02-23 DIAGNOSIS — R531 Weakness: Secondary | ICD-10-CM | POA: Diagnosis not present

## 2024-02-23 DIAGNOSIS — M25511 Pain in right shoulder: Secondary | ICD-10-CM

## 2024-02-23 DIAGNOSIS — R202 Paresthesia of skin: Secondary | ICD-10-CM

## 2024-02-23 DIAGNOSIS — G8929 Other chronic pain: Secondary | ICD-10-CM

## 2024-02-23 NOTE — Progress Notes (Unsigned)
 Pain Scale   Average Pain 4  RUE- fell in Dec, dislocated shoulder. No has pain, numbness and tingling. Difficultly grasping. Right handed

## 2024-02-24 NOTE — Procedures (Unsigned)
 EMG & NCV Findings: Evaluation of the right median motor and the right radial motor nerves showed reduced amplitude (R3.2, R1.1 mV).  The right median (across palm) sensory nerve showed prolonged distal peak latency (Wrist, 4.1 ms) and prolonged distal peak latency (Palm, 2.4 ms).  All remaining nerves (as indicated in the following tables) were within normal limits.    All examined muscles (as indicated in the following table) showed no evidence of electrical instability.    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  This finding could be incidental but clinical correlation paramount.    There is no evidence of axonal injury to the axillary nerve or brachial plexus.  This would not rule out a potential stretch injury earlier today early on that has recovered.    There is no significant electrodiagnostic evidence of any other focal nerve entrapment, brachial plexopathy or cervical radiculopathy.   Recommendations: 1.  Follow-up with referring physician.  Consider follow-up with spine surgeon if felt to be radicular. 2.  Continue current management of symptoms.  ___________________________ Collin Deal FAAPMR Board Certified, American Board of Physical Medicine and Rehabilitation    Nerve Conduction Studies Anti Sensory Summary Table   Stim Site NR Peak (ms) Norm Peak (ms) P-T Amp (V) Norm P-T Amp Site1 Site2 Delta-P (ms) Dist (cm) Vel (m/s) Norm Vel (m/s)  Right Median Acr Palm Anti Sensory (2nd Digit)  28.3C  Wrist    *4.1 <3.6 35.2 >10 Wrist Palm 1.7 0.0    Palm    *2.4 <2.0 31.6         Right Radial Anti Sensory (Base 1st Digit)  28C  Wrist    2.4 <3.1 22.7  Wrist Base 1st Digit 2.4 0.0    Right Ulnar Anti Sensory (5th Digit)  28.4C  Wrist    3.7 <3.7 24.9 >15.0 Wrist 5th Digit 3.7 14.0 38 >38   Motor Summary Table   Stim Site NR Onset (ms) Norm  Onset (ms) O-P Amp (mV) Norm O-P Amp Site1 Site2 Delta-0 (ms) Dist (cm) Vel (m/s) Norm Vel (m/s)  Right Median Motor (Abd Poll Brev)  28.1C  Wrist    4.0 <4.2 *3.2 >5 Elbow Wrist 3.9 19.5 50 >50  Elbow    7.9  3.1         Right Radial Motor (Ext Indicis)  28.4C  8cm    2.4 <2.5 *1.1 >1.7 Up Arm 8cm 3.3 20.0 61 >60  Up Arm    5.7  2.0         Right Ulnar Motor (Abd Dig Min)  28.2C  Wrist    3.5 <4.2 9.0 >3 B Elbow Wrist 3.0 17.0 57 >53  B Elbow    6.5  8.9  A Elbow B Elbow 0.9 10.0 111 >53  A Elbow    7.4  9.3          EMG   Side Muscle Nerve Root Ins Act Fibs Psw Amp Dur Poly Recrt Int Deatra Face Comment  Right Abd Poll Brev Median C8-T1 Nml Nml Nml Nml Nml 0 Nml Nml   Right 1stDorInt Ulnar C8-T1 Nml Nml Nml Nml Nml 0 Nml Nml   Right PronatorTeres Median C6-7 Nml Nml Nml Nml Nml 0 Nml Nml   Right Biceps Musculocut C5-6 Nml Nml Nml Nml Nml 0 Nml Nml   Right Deltoid Axillary C5-6 Nml Nml Nml Nml Nml  0 Nml Nml     Nerve Conduction Studies Anti Sensory Left/Right Comparison   Stim Site L Lat (ms) R Lat (ms) L-R Lat (ms) L Amp (V) R Amp (V) L-R Amp (%) Site1 Site2 L Vel (m/s) R Vel (m/s) L-R Vel (m/s)  Median Acr Palm Anti Sensory (2nd Digit)  28.3C  Wrist  *4.1   35.2  Wrist Palm     Palm  *2.4   31.6        Radial Anti Sensory (Base 1st Digit)  28C  Wrist  2.4   22.7  Wrist Base 1st Digit     Ulnar Anti Sensory (5th Digit)  28.4C  Wrist  3.7   24.9  Wrist 5th Digit  38    Motor Left/Right Comparison   Stim Site L Lat (ms) R Lat (ms) L-R Lat (ms) L Amp (mV) R Amp (mV) L-R Amp (%) Site1 Site2 L Vel (m/s) R Vel (m/s) L-R Vel (m/s)  Median Motor (Abd Poll Brev)  28.1C  Wrist  4.0   *3.2  Elbow Wrist  50   Elbow  7.9   3.1        Radial Motor (Ext Indicis)  28.4C  8cm  2.4   *1.1  Up Arm 8cm  61   Up Arm  5.7   2.0        Ulnar Motor (Abd Dig Min)  28.2C  Wrist  3.5   9.0  B Elbow Wrist  57   B Elbow  6.5   8.9  A Elbow B Elbow  111   A Elbow  7.4   9.3            Waveforms:

## 2024-02-26 NOTE — Telephone Encounter (Signed)
 VOB has been submitted for Monovisc, left knee.

## 2024-03-02 ENCOUNTER — Encounter: Payer: Self-pay | Admitting: Physical Medicine and Rehabilitation

## 2024-03-02 NOTE — Progress Notes (Addendum)
 Kathryn Russo - 73 y.o. female MRN [id number]  Date of birth: [birth year]  Office Visit Note: Visit Date: 02/23/2024 PCP: Mick Alamin, MD Referred by: Jasmine Mesi, MD  Subjective: Chief Complaint  Patient presents with   Right Arm - Weakness, Pain, Tingling   HPI: Kathryn Russo is a 73 y.o. female who comes in today at the request of Dr. Marykay Snipes for evaluation and management of chronic, worsening pain, numbness and tingling in the Right upper extremities.  Patient is Right hand dominant.  She reports a chronic history of shoulder pain with rotator cuff tear and then fall in December with dislocation.  They are looking at possible total shoulder replacement.  Since that time she has been in physical therapy and continues to have difficulty with grasping things with her hand and is getting some numbness and tingling in the hand as well somewhat nondermatomal.  No left-sided complaints.  No prior history of weakness in the hands.  She does have history of ACDF at [facility] in July of last year.  She does have a CT scan postsurgery that is reviewed below which is really nonfocal.  She is somewhat of a poor historian in terms of symptoms.  Dr. Rozelle Corning felt like she may have had a brachial plexus injury initially with a dislocation or axillary nerve issue.  However her biggest complaint today is grasping objects with her hand however.  She is not diabetic and has no thyroid disease.  No prior electrodiagnostic study.    I spent more than 30 minutes speaking face-to-face with the patient with 50% of the time in counseling and discussing coordination of care.      Review of Systems  Musculoskeletal:  Positive for back pain, joint pain and neck pain.  Neurological:  Positive for tingling and weakness.  All other systems reviewed and are negative.  Otherwise per HPI.  Assessment & Plan: Visit Diagnoses:    ICD-10-CM   1. Paresthesia of skin  R20.2 NCV with EMG  (electromyography)    2. Right arm pain  M79.601     3. Chronic right shoulder pain  M25.511    G89.29     4. Weakness  R53.1        Plan: Impression: Complicated history of chronic shoulder injury and pain with fall causing dislocation of the right shoulder in the setting of prior cervical spine issues with ACDF through [facility].  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  This finding could be incidental but clinical correlation paramount.    There is no evidence of axonal injury to the axillary nerve or brachial plexus.  This would not rule out a potential stretch injury earlier today early on that has recovered.    There is no significant electrodiagnostic evidence of any other focal nerve entrapment, brachial plexopathy or cervical radiculopathy.   Recommendations: 1.  Follow-up with referring physician.  Consider follow-up with spine surgeon if felt to be radicular. 2.  Continue current management of symptoms.  Meds & Orders: No orders of the defined types were placed in this encounter.   Orders Placed This Encounter  Procedures   NCV with EMG (electromyography)    Follow-up: Return for  G. Lynita Saris, MD.   Procedures: No procedures performed  EMG & NCV Findings: Evaluation of the right median motor and the right radial motor nerves  showed reduced amplitude (R3.2, R1.1 mV).  The right median (across palm) sensory nerve showed prolonged distal peak latency (Wrist, 4.1 ms) and prolonged distal peak latency (Palm, 2.4 ms).  All remaining nerves (as indicated in the following tables) were within normal limits.    All examined muscles (as indicated in the following table) showed no evidence of electrical instability.    Impression: The above electrodiagnostic study is ABNORMAL and reveals evidence of a mild to moderate right median nerve entrapment at  the wrist affecting sensory components.  This finding could be incidental but clinical correlation paramount.    There is no evidence of axonal injury to the axillary nerve or brachial plexus.  This would not rule out a potential stretch injury earlier today early on that has recovered.    There is no significant electrodiagnostic evidence of any other focal nerve entrapment, brachial plexopathy or cervical radiculopathy.   Recommendations: 1.  Follow-up with referring physician.  Consider follow-up with spine surgeon if felt to be radicular. 2.  Continue current management of symptoms.  ___________________________ Collin Deal FAAPMR Board Certified, American Board of Physical Medicine and Rehabilitation    Nerve Conduction Studies Anti Sensory Summary Table   Stim Site NR Peak (ms) Norm Peak (ms) P-T Amp (V) Norm P-T Amp Site1 Site2 Delta-P (ms) Dist (cm) Vel (m/s) Norm Vel (m/s)  Right Median Acr Palm Anti Sensory (2nd Digit)  28.3C  Wrist    *4.1 <3.6 35.2 >10 Wrist Palm 1.7 0.0    Palm    *2.4 <2.0 31.6         Right Radial Anti Sensory (Base 1st Digit)  28C  Wrist    2.4 <3.1 22.7  Wrist Base 1st Digit 2.4 0.0    Right Ulnar Anti Sensory (5th Digit)  28.4C  Wrist    3.7 <3.7 24.9 >15.0 Wrist 5th Digit 3.7 14.0 38 >38   Motor Summary Table   Stim Site NR Onset (ms) Norm Onset (ms) O-P Amp (mV) Norm O-P Amp Site1 Site2 Delta-0 (ms) Dist (cm) Vel (m/s) Norm Vel (m/s)  Right Median Motor (Abd Poll Brev)  28.1C  Wrist    4.0 <4.2 *3.2 >5 Elbow Wrist 3.9 19.5 50 >50  Elbow    7.9  3.1         Right Radial Motor (Ext Indicis)  28.4C  8cm    2.4 <2.5 *1.1 >1.7 Up Arm 8cm 3.3 20.0 61 >60  Up Arm    5.7  2.0         Right Ulnar Motor (Abd Dig Min)  28.2C  Wrist    3.5 <4.2 9.0 >3 B Elbow Wrist 3.0 17.0 57 >53  B Elbow    6.5  8.9  A Elbow B Elbow 0.9 10.0 111 >53  A Elbow    7.4  9.3          EMG   Side Muscle Nerve Root Ins Act Fibs Psw Amp Dur Poly Recrt Int Deatra Face  Comment  Right Abd Poll Brev Median C8-T1 Nml Nml Nml Nml Nml 0 Nml Nml   Right 1stDorInt Ulnar C8-T1 Nml Nml Nml Nml Nml 0 Nml Nml   Right PronatorTeres Median C6-7 Nml Nml Nml Nml Nml 0 Nml Nml   Right Biceps Musculocut C5-6 Nml Nml Nml Nml Nml 0 Nml Nml   Right Deltoid Axillary C5-6 Nml Nml Nml Nml Nml 0 Nml Nml     Nerve Conduction Studies Anti Sensory Left/Right Comparison  Stim Site L Lat (ms) R Lat (ms) L-R Lat (ms) L Amp (V) R Amp (V) L-R Amp (%) Site1 Site2 L Vel (m/s) R Vel (m/s) L-R Vel (m/s)  Median Acr Palm Anti Sensory (2nd Digit)  28.3C  Wrist  *4.1   35.2  Wrist Palm     Palm  *2.4   31.6        Radial Anti Sensory (Base 1st Digit)  28C  Wrist  2.4   22.7  Wrist Base 1st Digit     Ulnar Anti Sensory (5th Digit)  28.4C  Wrist  3.7   24.9  Wrist 5th Digit  38    Motor Left/Right Comparison   Stim Site L Lat (ms) R Lat (ms) L-R Lat (ms) L Amp (mV) R Amp (mV) L-R Amp (%) Site1 Site2 L Vel (m/s) R Vel (m/s) L-R Vel (m/s)  Median Motor (Abd Poll Brev)  28.1C  Wrist  4.0   *3.2  Elbow Wrist  50   Elbow  7.9   3.1        Radial Motor (Ext Indicis)  28.4C  8cm  2.4   *1.1  Up Arm 8cm  61   Up Arm  5.7   2.0        Ulnar Motor (Abd Dig Min)  28.2C  Wrist  3.5   9.0  B Elbow Wrist  57   B Elbow  6.5   8.9  A Elbow B Elbow  111   A Elbow  7.4   9.3           Waveforms:             Clinical History: CT CERVICAL SPINE FINDINGS   Alignment: Lower cervical levocurvature. No substantial sagittal subluxation.   Skull base and vertebrae: C4-C6 ACDF. No acute fracture. Partially imaged posterior fusion in the upper thoracic spine.   Soft tissues and spinal canal: No prevertebral fluid or swelling. No visible canal hematoma.   Disc levels: Multilevel facet and uncovertebral hypertrophy with varying degrees of neural foraminal stenosis that was better characterized on prior MRI of the cervical spine.   Upper chest: Visualized lung apices are clear.    IMPRESSION: No evidence of acute abnormality intracranially or in the cervical spine.     Electronically Signed   By: Stevenson Elbe M.D.   On: 09/28/2023 16:41   She reports that she has never smoked. She has never used smokeless tobacco. No results for input(s): "HGBA1C", "LABURIC" in the last 8760 hours.  Objective:  VS:  HT:    WT:   BMI:     BP:   HR: bpm  TEMP: ( )  RESP:  Physical Exam Vitals and nursing note reviewed.  Constitutional:      General: She is not in acute distress.    Appearance: Normal appearance. She is well-developed. She is not ill-appearing.  HENT:     Head: Normocephalic and atraumatic.  Eyes:     Conjunctiva/sclera: Conjunctivae normal.     Pupils: Pupils are equal, round, and reactive to light.  Cardiovascular:     Rate and Rhythm: Normal rate.     Pulses: Normal pulses.  Pulmonary:     Effort: Pulmonary effort is normal.  Musculoskeletal:        General: Tenderness present. No swelling or deformity.     Right lower leg: No edema.     Left lower leg: No edema.     Comments: Inspection  reveals no atrophy of the bilateral APB or FDI or hand intrinsics. There is no swelling, color changes, allodynia or dystrophic changes. There is 5 out of 5 strength in the bilateral wrist extension, finger abduction and long finger flexion. There is intact sensation to light touch in all dermatomal and peripheral nerve distributions. There is a negative Froment's test bilaterally. There is a negative Tinel's test at the bilateral wrist and elbow. There is a equivocally positive Phalen's on the right . There is a negative Hoffmann's test bilaterally.  Skin:    General: Skin is warm and dry.     Findings: No erythema or rash.  Neurological:     General: No focal deficit present.     Mental Status: She is alert and oriented to person, place, and time.     Cranial Nerves: No cranial nerve deficit.     Sensory: No sensory deficit.     Motor: No weakness or  abnormal muscle tone.     Coordination: Coordination normal.     Gait: Gait normal.  Psychiatric:        Mood and Affect: Mood normal.        Behavior: Behavior normal.     Ortho Exam  Imaging: No results found.  Past Medical/Family/Surgical/Social History: Medications & Allergies reviewed per EMR, new medications updated. Patient Active Problem List   Diagnosis Date Noted   CKD (chronic kidney disease) stage 3, GFR 30-59 ml/min (HCC) 03/30/2023   Irritable bowel syndrome with diarrhea 01/26/2019   Osteoarthritis of knee 03/05/2018   Incomplete tear of right rotator cuff 01/06/2018   Chronic right shoulder pain 06/16/2017   Headache, chronic daily 11/10/2016   Combined forms of age-related cataract of left eye 12/13/2015   Obesity 12/08/2013   Past Medical History:  Diagnosis Date   Arthritis    Colitis    High cholesterol    History reviewed. No pertinent family history. Past Surgical History:  Procedure Laterality Date   CERVICAL SPINE SURGERY     Social History   Occupational History   Not on file  Tobacco Use   Smoking status: Never   Smokeless tobacco: Never  Substance and Sexual Activity   Alcohol use: No   Drug use: Never   Sexual activity: Not Currently

## 2024-03-15 ENCOUNTER — Encounter: Payer: Self-pay | Admitting: Orthopedic Surgery

## 2024-03-16 ENCOUNTER — Telehealth: Payer: Self-pay

## 2024-03-16 DIAGNOSIS — M1712 Unilateral primary osteoarthritis, left knee: Secondary | ICD-10-CM

## 2024-03-16 NOTE — Telephone Encounter (Signed)
Talked with patient and appt.has been scheduled for gel injection.  

## 2024-03-23 ENCOUNTER — Ambulatory Visit: Admitting: Orthopedic Surgery

## 2024-03-23 ENCOUNTER — Ambulatory Visit (INDEPENDENT_AMBULATORY_CARE_PROVIDER_SITE_OTHER): Admitting: Orthopedic Surgery

## 2024-03-23 DIAGNOSIS — M1712 Unilateral primary osteoarthritis, left knee: Secondary | ICD-10-CM

## 2024-03-23 DIAGNOSIS — R202 Paresthesia of skin: Secondary | ICD-10-CM | POA: Diagnosis not present

## 2024-03-23 NOTE — Progress Notes (Unsigned)
 Office Visit Note   Patient: Kathryn Russo           Date of Birth: 1951-05-11           MRN: [id number] Visit Date: 03/23/2024 Requested by: Mick Alamin, MD [address] [place],  [place] [address] PCP: Mick Alamin, MD  Subjective: Chief Complaint  Patient presents with   Left Knee - Follow-up    Monovisc left knee      HPI: Kathryn Russo is a 73 y.o. female who presents to the office reporting ***.                ROS: All systems reviewed are negative as they relate to the chief complaint within the history of present illness.  Patient denies fevers or chills.  Assessment & Plan: Visit Diagnoses:  1. Arthritis of left knee   2. Paresthesia of skin     Plan: ***  Follow-Up Instructions: No follow-ups on file.   Orders:  No orders of the defined types were placed in this encounter.  No orders of the defined types were placed in this encounter.     Procedures: Large Joint Inj: L knee on 03/23/2024 1:26 PM Indications: pain, joint swelling and diagnostic evaluation Details: 18 G 1.5 in needle, superolateral approach  Arthrogram: No  Medications: 5 mL lidocaine  1 %; 88 mg Hyaluronan 88 MG/4ML Outcome: tolerated well, no immediate complications Procedure, treatment alternatives, risks and benefits explained, specific risks discussed. Consent was given by the patient. Immediately prior to procedure a time out was called to verify the correct patient, procedure, equipment, support staff and site/side marked as required. Patient was prepped and draped in the usual sterile fashion.    Lot [id number]  inical Data: No additional findings.  Objective: Vital Signs: There were no vitals taken for this visit.  Physical Exam:  Constitutional: Patient appears well-developed HEENT:  Head: Normocephalic Eyes:EOM are normal Neck: Normal range of motion Cardiovascular: Normal rate Pulmonary/chest: Effort normal Neurologic: Patient is  alert Skin: Skin is warm Psychiatric: Patient has normal mood and affect  Ortho Exam: ***  Specialty Comments:  CT CERVICAL SPINE FINDINGS   Alignment: Lower cervical levocurvature. No substantial sagittal subluxation.   Skull base and vertebrae: C4-C6 ACDF. No acute fracture. Partially imaged posterior fusion in the upper thoracic spine.   Soft tissues and spinal canal: No prevertebral fluid or swelling. No visible canal hematoma.   Disc levels: Multilevel facet and uncovertebral hypertrophy with varying degrees of neural foraminal stenosis that was better characterized on prior MRI of the cervical spine.   Upper chest: Visualized lung apices are clear.   IMPRESSION: No evidence of acute abnormality intracranially or in the cervical spine.     Electronically Signed   By: Stevenson Elbe M.D.   On: 09/28/2023 16:41  Imaging: No results found.   PMFS History: Patient Active Problem List   Diagnosis Date Noted   CKD (chronic kidney disease) stage 3, GFR 30-59 ml/min (HCC) 03/30/2023   Irritable bowel syndrome with diarrhea 01/26/2019   Osteoarthritis of knee 03/05/2018   Incomplete tear of right rotator cuff 01/06/2018   Chronic right shoulder pain 06/16/2017   Headache, chronic daily 11/10/2016   Combined forms of age-related cataract of left eye 12/13/2015   Obesity 12/08/2013   Past Medical History:  Diagnosis Date   Arthritis    Colitis    High cholesterol     No family history on file.  Past Surgical History:  Procedure Laterality Date   CERVICAL SPINE SURGERY     Social History   Occupational History   Not on file  Tobacco Use   Smoking status: Never   Smokeless tobacco: Never  Substance and Sexual Activity   Alcohol use: No   Drug use: Never   Sexual activity: Not Currently

## 2024-03-26 ENCOUNTER — Encounter: Payer: Self-pay | Admitting: Orthopedic Surgery

## 2024-03-26 MED ORDER — HYALURONAN 88 MG/4ML IX SOSY
88.0000 mg | PREFILLED_SYRINGE | INTRA_ARTICULAR | Status: AC | PRN
Start: 2024-03-23 — End: 2024-03-23
  Administered 2024-03-23: 88 mg via INTRA_ARTICULAR

## 2024-03-26 MED ORDER — LIDOCAINE HCL 1 % IJ SOLN
5.0000 mL | INTRAMUSCULAR | Status: AC | PRN
  Administered 2024-03-23: 5 mL

## 2024-04-04 NOTE — Progress Notes (Signed)
 Left voicemail for patient to call back to schedule

## 2024-08-08 ENCOUNTER — Encounter: Payer: Self-pay | Admitting: Radiology
